# Patient Record
Sex: Male | Born: 1970 | Race: White | Hispanic: No | Marital: Single | Smoking: Never smoker
Health system: Southern US, Community
[De-identification: ages and names within clinical notes are randomized; demographics above are authoritative.]

---
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
## 2004-02-19 ENCOUNTER — Ambulatory Visit: Payer: Self-pay | Admitting: Family Medicine

## 2004-02-25 ENCOUNTER — Ambulatory Visit: Payer: Self-pay | Admitting: Family Medicine

## 2004-03-28 ENCOUNTER — Ambulatory Visit: Payer: Self-pay | Admitting: Family Medicine

## 2006-02-03 ENCOUNTER — Emergency Department (HOSPITAL_COMMUNITY): Admission: EM | Admit: 2006-02-03 | Discharge: 2006-02-03 | Payer: Self-pay | Admitting: Family Medicine

## 2006-06-12 ENCOUNTER — Ambulatory Visit: Payer: Self-pay | Admitting: Family Medicine

## 2007-01-14 ENCOUNTER — Ambulatory Visit: Payer: Self-pay | Admitting: Family Medicine

## 2007-01-14 DIAGNOSIS — J018 Other acute sinusitis: Secondary | ICD-10-CM
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
## 2010-08-26 ENCOUNTER — Other Ambulatory Visit (INDEPENDENT_AMBULATORY_CARE_PROVIDER_SITE_OTHER): Payer: BC Managed Care – PPO

## 2010-08-26 DIAGNOSIS — Z0000 Encounter for general adult medical examination without abnormal findings: Secondary | ICD-10-CM

## 2010-08-26 LAB — BASIC METABOLIC PANEL
CO2: 29 mEq/L (ref 19–32)
Calcium: 9.3 mg/dL (ref 8.4–10.5)
Chloride: 109 mEq/L (ref 96–112)
Creatinine, Ser: 1 mg/dL (ref 0.4–1.5)
Glucose, Bld: 88 mg/dL (ref 70–99)
Sodium: 144 mEq/L (ref 135–145)

## 2010-08-26 LAB — LIPID PANEL
HDL: 60.2 mg/dL (ref >39.00)
LDL Cholesterol: 86 mg/dL (ref 0–99)
VLDL: 5.4 mg/dL (ref 0.0–40.0)

## 2010-08-26 LAB — CBC WITH DIFFERENTIAL/PLATELET
Hemoglobin: 14.3 g/dL (ref 13.0–17.0)
MCV: 87 fl (ref 78.0–100.0)
Neutro Abs: 3.2 10*3/uL (ref 1.4–7.7)
Neutrophils Relative %: 58.4 % (ref 43.0–77.0)
RBC: 4.92 Mil/uL (ref 4.22–5.81)
RDW: 13.8 % (ref 11.5–14.6)

## 2010-08-26 LAB — HEPATIC FUNCTION PANEL
ALT: 18 U/L (ref 0–53)
AST: 20 U/L (ref 0–37)
Bilirubin, Direct: 0.1 mg/dL (ref 0.0–0.3)
Total Bilirubin: 1.2 mg/dL (ref 0.3–1.2)
Total Protein: 7.1 g/dL (ref 6.0–8.3)

## 2010-09-06 ENCOUNTER — Ambulatory Visit (INDEPENDENT_AMBULATORY_CARE_PROVIDER_SITE_OTHER): Payer: BC Managed Care – PPO | Admitting: Family Medicine

## 2010-09-06 ENCOUNTER — Encounter: Payer: Self-pay | Admitting: Family Medicine

## 2010-09-06 DIAGNOSIS — Z0000 Encounter for general adult medical examination without abnormal findings: Secondary | ICD-10-CM

## 2010-09-06 DIAGNOSIS — Z23 Encounter for immunization: Secondary | ICD-10-CM

## 2010-09-06 DIAGNOSIS — J018 Other acute sinusitis: Secondary | ICD-10-CM

## 2010-09-06 DIAGNOSIS — H9190 Unspecified hearing loss, unspecified ear: Secondary | ICD-10-CM

## 2010-09-06 DIAGNOSIS — H9193 Unspecified hearing loss, bilateral: Secondary | ICD-10-CM | POA: Insufficient documentation

## 2010-09-06 NOTE — Patient Instructions (Signed)
Use a 50+ a sunscreen on a daily basis and a baseball cap with a big bill to protect y face.  Return in two years for general medical exam, sooner if any problems

## 2010-09-06 NOTE — Progress Notes (Signed)
  Subjective:    Patient ID: Kevin [name], male    DOB: 1970-08-01, 39 y.o.   MRN: [id number]  HPI The fat is a 40 year old, married male, nonsmoker, who comes in today for general physical examination  Is always been in excellent health.  Has had no chronic health problems.  He was only hospitalized once for right shoulder support.  Surgery.  No major illnesses or injuries known allergies.  He takes no medication on a regular basis.  Tetanus booster given today.  Review of systems negative except for hearing loss, and  a lot of sun exposure.Also on physical examination there is evidence of chronic sun damage on his face   Review of Systems  Constitutional: Negative.   HENT: Negative.   Eyes: Negative.   Respiratory: Negative.   Cardiovascular: Negative.   Gastrointestinal: Negative.   Genitourinary: Negative.   Musculoskeletal: Negative.   Skin: Negative.   Neurological: Negative.   Hematological: Negative.   Psychiatric/Behavioral: Negative.        Objective:   Physical Exam  Constitutional: He is oriented to person, place, and time. He appears well-developed and well-nourished.  HENT:  Head: Normocephalic and atraumatic.  Right Ear: External ear normal.  Left Ear: External ear normal.  Nose: Nose normal.  Mouth/Throat: Oropharynx is clear and moist.       Bilateral cerumen impactions  Eyes: Conjunctivae and EOM are normal. Pupils are equal, round, and reactive to light.  Neck: Normal range of motion. Neck supple. No JVD present. No tracheal deviation present. No thyromegaly present.  Cardiovascular: Normal rate, regular rhythm, normal heart sounds and intact distal pulses.  Exam reveals no gallop and no friction rub.   No murmur heard. Pulmonary/Chest: Effort normal and breath sounds normal. No stridor. No respiratory distress. He has no wheezes. He has no rales. He exhibits no tenderness.  Abdominal: Soft. Bowel sounds are normal. He exhibits no distension and no mass.  There is no tenderness. There is no rebound and no guarding.  Genitourinary: Rectum normal, prostate normal and penis normal. Guaiac negative stool. No penile tenderness.  Musculoskeletal: Normal range of motion. He exhibits no edema and no tenderness.  Lymphadenopathy:    He has no cervical adenopathy.  Neurological: He is alert and oriented to person, place, and time. He has normal reflexes. No cranial nerve deficit. He exhibits normal muscle tone.  Skin: Skin is warm and dry. No rash noted. No erythema. No pallor.  Psychiatric: He has a normal mood and affect. His behavior is normal. Judgment and thought content normal.          Assessment & Plan:  Healthy male.  Bilateral cerumen impactions removed by suction and irrigation.  Evidence of sun damage as specimens knows recommended a baseball cap and sunscreens SPF 50+ daily.  Return in two years for general medical exam

## 2010-09-13 ENCOUNTER — Ambulatory Visit: Payer: BC Managed Care – PPO | Admitting: Family Medicine

## 2010-09-19 ENCOUNTER — Encounter: Payer: Self-pay | Admitting: Family Medicine

## 2010-09-19 ENCOUNTER — Ambulatory Visit (INDEPENDENT_AMBULATORY_CARE_PROVIDER_SITE_OTHER): Payer: BC Managed Care – PPO | Admitting: Family Medicine

## 2010-09-19 DIAGNOSIS — H9193 Unspecified hearing loss, bilateral: Secondary | ICD-10-CM

## 2010-09-19 DIAGNOSIS — H9190 Unspecified hearing loss, unspecified ear: Secondary | ICD-10-CM

## 2010-09-19 NOTE — Progress Notes (Signed)
  Subjective:    Patient ID: Kevin [name], male    DOB: [birth year], 40 y.o.   MRN: [id number]  HPIFred is a 40 year old male, married, nonsmoker, who returns to have the rest of his ear wax removed.  We seen last week for a physical.  He did have a lot of earwax.  We were able to remove most of it, but not all.  He's been using ear wax dissolving drops.  Kevin [name] took him to the exam room, flushed his ear out and resolve earwax problem    Review of Systems    Negative Objective:   Physical Exam   None procedure see above     Assessment & Plan:  Hearing loss, secondary to ear wax, resolved

## 2011-03-28 ENCOUNTER — Ambulatory Visit (INDEPENDENT_AMBULATORY_CARE_PROVIDER_SITE_OTHER): Payer: BC Managed Care – PPO | Admitting: Family Medicine

## 2011-03-28 ENCOUNTER — Encounter: Payer: Self-pay | Admitting: Family Medicine

## 2011-03-28 DIAGNOSIS — X58XXXA Exposure to other specified factors, initial encounter: Secondary | ICD-10-CM

## 2011-03-28 DIAGNOSIS — T148XXA Other injury of unspecified body region, initial encounter: Secondary | ICD-10-CM

## 2011-03-28 DIAGNOSIS — IMO0002 Reserved for concepts with insufficient information to code with codable children: Secondary | ICD-10-CM | POA: Insufficient documentation
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
## 2011-03-28 NOTE — Patient Instructions (Signed)
Return in 5 days for suture removal.

## 2011-08-27 ENCOUNTER — Emergency Department (HOSPITAL_COMMUNITY)
Admission: EM | Admit: 2011-08-27 | Discharge: 2011-08-27 | Disposition: A | Discharge status: Home / Self Care | Payer: BC Managed Care – PPO | Attending: Emergency Medicine | Admitting: Emergency Medicine

## 2011-08-27 ENCOUNTER — Encounter (HOSPITAL_COMMUNITY): Payer: Self-pay | Admitting: *Deleted

## 2011-08-27 ENCOUNTER — Emergency Department (HOSPITAL_COMMUNITY): Payer: BC Managed Care – PPO

## 2011-08-27 DIAGNOSIS — S61209A Unspecified open wound of unspecified finger without damage to nail, initial encounter: Secondary | ICD-10-CM | POA: Insufficient documentation

## 2011-08-27 DIAGNOSIS — W268XXA Contact with other sharp object(s), not elsewhere classified, initial encounter: Secondary | ICD-10-CM | POA: Insufficient documentation

## 2011-08-27 DIAGNOSIS — IMO0002 Reserved for concepts with insufficient information to code with codable children: Secondary | ICD-10-CM

## 2011-08-27 MED ORDER — CEPHALEXIN 500 MG PO CAPS: 500.0000 mg | ORAL_CAPSULE | Freq: Four times a day (QID) | ORAL | Status: AC

## 2011-08-27 NOTE — ED Notes (Signed)
MD at bedside.Dr. Weingold 

## 2011-08-27 NOTE — ED Notes (Signed)
Has large laceration to posterior right index finger, bleeding controlled at triage, tetanus current.

## 2011-08-27 NOTE — ED Provider Notes (Signed)
I saw and evaluated the patient, reviewed the resident's note and I agree with the findings and plan.   .Face to face Exam:  General:  Awake HEENT:  Atraumatic Resp:  Normal effort Abd:  Nondistended Neuro:No focal weakness Lymph: No adenopathy   Nelia Shi, MD 08/27/11 2120

## 2011-08-27 NOTE — Consult Note (Signed)
Reason for Consult:right index injury Referring Physician: beaton  Kevin [name] is an 41 y.o. male.  HPI: as above with right index nailbed injury  History reviewed. No pertinent past medical history.  History reviewed. No pertinent past surgical history.  History reviewed. No pertinent family history.  Social History:  reports that he has never smoked. He does not have any smokeless tobacco history on file. He reports that he does not drink alcohol or use illicit drugs.  Allergies: No Known Allergies  Medications: I have reviewed the patient's current medications.  No results found for this or any previous visit (from the past 48 hour(s)).  Dg Finger Index Right  08/27/2011  *RADIOLOGY REPORT*  Clinical Data: Finger injury.  RIGHT INDEX FINGER 2+V  Comparison: No priors.  Findings: Three views of the right second finger demonstrate no acute fracture, subluxation or dislocation.  There is mild irregularity of the soft tissues overlying the distal phalanx. Incidentally noted is a small well corticated bony fragment adjacent to the lateral base of the third proximal phalanx, likely sequela of remote avulsion fracture.  IMPRESSION: 1.  No acute radiographic abnormality of the bones of the right second finger.  Original Report Authenticated By: Florencia Reasons, M.D.    Review of Systems  All other systems reviewed and are negative.   Blood pressure 112/63, pulse 81, temperature 98.2 F (36.8 C), temperature source Oral, resp. rate 20, SpO2 96.00%. Physical Exam  Constitutional: He is oriented to person, place, and time. He appears well-developed and well-nourished.  HENT:  Head: Normocephalic and atraumatic.  Cardiovascular: Normal rate.   Respiratory: Effort normal.  Musculoskeletal:       Hands: Neurological: He is alert and oriented to person, place, and time.  Skin: Skin is warm.  Psychiatric: He has a normal mood and affect. His behavior is normal. Judgment and thought  content normal.    Assessment/Plan: As above  Patient given 2% lidocaine digital block at bedside  Prepped and draped in usual fashion and nailbed repair and skin closure performed  F/u in my office thursday  [facility] 08/27/2011, 10:19 AM

## 2011-08-27 NOTE — ED Notes (Signed)
MD at bedside. 
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
## 2013-11-25 ENCOUNTER — Telehealth: Payer: Self-pay | Admitting: Family Medicine

## 2013-11-25 DIAGNOSIS — K649 Unspecified hemorrhoids: Secondary | ICD-10-CM

## 2013-11-25 NOTE — Telephone Encounter (Signed)
Pt would like a referral to see md that specialist in hemorrhoids. Pt last ov was in 2013

## 2013-11-25 NOTE — Telephone Encounter (Signed)
Okay per Dr Todd.  Referral placed. 

## 2013-11-27 ENCOUNTER — Ambulatory Visit: Payer: BC Managed Care – PPO | Admitting: Family Medicine

## 2018-09-25 ENCOUNTER — Ambulatory Visit (INDEPENDENT_AMBULATORY_CARE_PROVIDER_SITE_OTHER): Payer: BC Managed Care – PPO | Admitting: Family Medicine

## 2018-09-25 ENCOUNTER — Other Ambulatory Visit: Payer: Self-pay

## 2018-09-25 ENCOUNTER — Encounter: Payer: Self-pay | Admitting: Family Medicine

## 2018-09-25 VITALS — BP 102/65 | HR 58 | Temp 98.7°F | Resp 14 | Wt 179.4 lb

## 2018-09-25 DIAGNOSIS — Z0000 Encounter for general adult medical examination without abnormal findings: Secondary | ICD-10-CM

## 2018-09-25 DIAGNOSIS — Z13220 Encounter for screening for lipoid disorders: Secondary | ICD-10-CM

## 2018-09-25 DIAGNOSIS — Z130 Encounter for screening for diseases of the blood and blood-forming organs and certain disorders involving the immune mechanism: Secondary | ICD-10-CM

## 2018-09-25 DIAGNOSIS — Z1329 Encounter for screening for other suspected endocrine disorder: Secondary | ICD-10-CM

## 2018-09-25 DIAGNOSIS — Z131 Encounter for screening for diabetes mellitus: Secondary | ICD-10-CM | POA: Diagnosis not present

## 2018-09-25 NOTE — Patient Instructions (Addendum)
No concerns on exam today. I will check some labs today, and let you know results in next 2 weeks. I do recommend flu vaccine - let me know if there are questions. Info on vaccines: [email or web address]   Preventive Care 32-48 Years Old, Male Preventive care refers to lifestyle choices and visits with your health care provider that can promote health and wellness. This includes:  A yearly physical exam. This is also called an annual well check.  Regular dental and eye exams.  Immunizations.  Screening for certain conditions.  Healthy lifestyle choices, such as eating a healthy diet, getting regular exercise, not using drugs or products that contain nicotine and tobacco, and limiting alcohol use. What can I expect for my preventive care visit? Physical exam Your health care provider will check:  Height and weight. These may be used to calculate body mass index (BMI), which is a measurement that tells if you are at a healthy weight.  Heart rate and blood pressure.  Your skin for abnormal spots. Counseling Your health care provider may ask you questions about:  Alcohol, tobacco, and drug use.  Emotional well-being.  Home and relationship well-being.  Sexual activity.  Eating habits.  Work and work Statistician. What immunizations do I need?  Influenza (flu) vaccine  This is recommended every year. Tetanus, diphtheria, and pertussis (Tdap) vaccine  You may need a Td booster every 10 years. Varicella (chickenpox) vaccine  You may need this vaccine if you have not already been vaccinated. Zoster (shingles) vaccine  You may need this after age 61. Measles, mumps, and rubella (MMR) vaccine  You may need at least one dose of MMR if you were born in 1957 or later. You may also need a second dose. Pneumococcal conjugate (PCV13) vaccine  You may need this if you have certain conditions and were not previously vaccinated. Pneumococcal  polysaccharide (PPSV23) vaccine  You may need one or two doses if you smoke cigarettes or if you have certain conditions. Meningococcal conjugate (MenACWY) vaccine  You may need this if you have certain conditions. Hepatitis A vaccine  You may need this if you have certain conditions or if you travel or work in places where you may be exposed to hepatitis A. Hepatitis B vaccine  You may need this if you have certain conditions or if you travel or work in places where you may be exposed to hepatitis B. Haemophilus influenzae type b (Hib) vaccine  You may need this if you have certain risk factors. Human papillomavirus (HPV) vaccine  If recommended by your health care provider, you may need three doses over 6 months. You may receive vaccines as individual doses or as more than one vaccine together in one shot (combination vaccines). Talk with your health care provider about the risks and benefits of combination vaccines. What tests do I need? Blood tests  Lipid and cholesterol levels. These may be checked every 5 years, or more frequently if you are over 18 years old.  Hepatitis C test.  Hepatitis B test. Screening  Lung cancer screening. You may have this screening every year starting at age 24 if you have a 30-pack-year history of smoking and currently smoke or have quit within the past 15 years.  Prostate cancer screening. Recommendations will vary depending on your family history and other risks.  Colorectal cancer screening. All adults should have this screening starting at age 75 and continuing until age 58. Your health care provider may recommend screening at age 41 if  you are at increased risk. You will have tests every 1-10 years, depending on your results and the type of screening test.  Diabetes screening. This is done by checking your blood sugar (glucose) after you have not eaten for a while (fasting). You may have this done every 1-3 years.  Sexually transmitted  disease (STD) testing. Follow these instructions at home: Eating and drinking  Eat a diet that includes fresh fruits and vegetables, whole grains, lean protein, and low-fat dairy products.  Take vitamin and mineral supplements as recommended by your health care provider.  Do not drink alcohol if your health care provider tells you not to drink.  If you drink alcohol: ? Limit how much you have to 0-2 drinks a day. ? Be aware of how much alcohol is in your drink. In the [place] one drink equals one 12 oz bottle of beer (355 mL), one 5 oz glass of wine (148 mL), or one 1 oz glass of hard liquor (44 mL). Lifestyle  Take daily care of your teeth and gums.  Stay active. Exercise for at least 30 minutes on 5 or more days each week.  Do not use any products that contain nicotine or tobacco, such as cigarettes, e-cigarettes, and chewing tobacco. If you need help quitting, ask your health care provider.  If you are sexually active, practice safe sex. Use a condom or other form of protection to prevent STIs (sexually transmitted infections).  Talk with your health care provider about taking a low-dose aspirin every day starting at age 21. What's next?  Go to your health care provider once a year for a well check visit.  Ask your health care provider how often you should have your eyes and teeth checked.  Stay up to date on all vaccines. This information is not intended to replace advice given to you by your health care provider. Make sure you discuss any questions you have with your health care provider. Document Released: 02/05/2015 Document Revised: 01/03/2018 Document Reviewed: 01/03/2018 Elsevier Patient Education  [employer].    If you have lab work done today you will be contacted with your lab results within the next 2 weeks.  If you have not heard from [place] then please contact us. The fastest way to get your results is to register for My Chart.   IF you received an x-ray  today, you will receive an invoice from [facility]. Please contact [facility] at [phone] with questions or concerns regarding your invoice.   IF you received labwork today, you will receive an invoice from [facility]. Please contact [facility] at [phone] with questions or concerns regarding your invoice.   Our billing staff will not be able to assist you with questions regarding bills from these companies.  You will be contacted with the lab results as soon as they are available. The fastest way to get your results is to activate your My Chart account. Instructions are located on the last page of this paperwork. If you have not heard from [place] regarding the results in 2 weeks, please contact this office.

## 2018-09-25 NOTE — Progress Notes (Signed)
Subjective:    Patient ID: Kevin [name], male    DOB: 12/12/1970, 48 y.o.   MRN: [id number]  HPI Kevin [name] is a 48 y.o. male Presents today for: Chief Complaint  Patient presents with  . Establish Care    Here for a general visit to establish care   Here to establish care, new PCP.  Prior pt of Dr. Sherren Mocha. Last visit in 2013.  No recent bloodwork.  No acute concerns today.  No hearing aids. Hearing ok now. History of cerumen impaction. Has tried some home relief in past.   R shoulder surgery, [place] procedure. 1990. Functioning ok.  Rides road and mountain bikes.  Farmer - flowers.  Has degree in microbiology. Reviews weekly MMR.  Alcohol: 1 every 2 months.  Tobacco - none.  62 yo son - Eduard Clos.  Declines STI testing.  Not fasting - ate 3 hrs ago.   FH:  Dad with bladder CA - asbestos related, diabetes.  Mom - parkinson's  Cancer screening:  No colon CA in family.  Declines prostate cancer screening.   Immunization History  Administered Date(s) Administered  . Tdap 09/06/2010  flu vaccine declines.  Depression screen [facility] 2/9 09/25/2018  Decreased Interest 0  Down, Depressed, Hopeless 0  PHQ - 2 Score 0   No corrective lenses.   Dental: infrequent visits.  Exercise:  3 days per week - 1.5 hr.  Body mass index is 24 kg/m.    Patient Active Problem List   Diagnosis Date Noted  . Laceration 03/28/2011  . Hearing loss of both ears 09/06/2010  . OTHER ACUTE SINUSITIS 01/14/2007   History reviewed. No pertinent past medical history. Past Surgical History:  Procedure Laterality Date  . BRISTOW PROCEDURE Right 1990   No Known Allergies Prior to Admission medications   Not on File   Social History   Socioeconomic History  . Marital status: Single    Spouse name: Not on file  . Number of children: Not on file  . Years of education: Not on file  . Highest education level: Not on file  Occupational History  . Not on file  Social Needs  .  Financial resource strain: Not on file  . Food insecurity    Worry: Not on file    Inability: Not on file  . Transportation needs    Medical: Not on file    Non-medical: Not on file  Tobacco Use  . Smoking status: Never Smoker  . Smokeless tobacco: Never Used  Substance and Sexual Activity  . Alcohol use: No  . Drug use: No  . Sexual activity: Not on file  Lifestyle  . Physical activity    Days per week: Not on file    Minutes per session: Not on file  . Stress: Not on file  Relationships  . Social Herbalist on phone: Not on file    Gets together: Not on file    Attends religious service: Not on file    Active member of club or organization: Not on file    Attends meetings of clubs or organizations: Not on file    Relationship status: Not on file  . Intimate partner violence    Fear of current or ex partner: Not on file    Emotionally abused: Not on file    Physically abused: Not on file    Forced sexual activity: Not on file  Other Topics Concern  . Not on file  Social History Narrative  . Not on file    Review of Systems Per HPI.     Objective:   Physical Exam Vitals signs reviewed.  Constitutional:      Appearance: He is well-developed.  HENT:     Head: Normocephalic and atraumatic.     Right Ear: External ear normal.     Left Ear: External ear normal.  Eyes:     Conjunctiva/sclera: Conjunctivae normal.     Pupils: Pupils are equal, round, and reactive to light.  Neck:     Musculoskeletal: Normal range of motion and neck supple.     Thyroid: No thyromegaly.  Cardiovascular:     Rate and Rhythm: Normal rate and regular rhythm.     Heart sounds: Normal heart sounds.  Pulmonary:     Effort: Pulmonary effort is normal. No respiratory distress.     Breath sounds: Normal breath sounds. No wheezing.  Abdominal:     General: There is no distension.     Palpations: Abdomen is soft.     Tenderness: There is no abdominal tenderness.     Hernia: A  hernia is present.  Musculoskeletal: Normal range of motion.        General: No tenderness.  Lymphadenopathy:     Cervical: No cervical adenopathy.  Skin:    General: Skin is warm and dry.  Neurological:     Mental Status: He is alert and oriented to person, place, and time.     Deep Tendon Reflexes: Reflexes are normal and symmetric.  Psychiatric:        Behavior: Behavior normal.    Vitals:   09/25/18 1104  BP: 102/65  Pulse: (!) 58  Resp: 14  Temp: 98.7 F (37.1 C)  TempSrc: Oral  SpO2: 98%  Weight: 179 lb 6.4 oz (81.4 kg)        Assessment & Plan:   Kevin [name] is a 48 y.o. male Annual physical exam  - -anticipatory guidance as below in AVS, screening labs above. Health maintenance items as above in HPI discussed/recommended as applicable.   -Flu vaccine recommended, declined at present.  Screening for diabetes mellitus - Plan: Comprehensive metabolic panel  Screening, anemia, deficiency, iron - Plan: CBC  Screening for hyperlipidemia - Plan: Lipid panel  Screening for thyroid disorder - Plan: TSH   No orders of the defined types were placed in this encounter.  Patient Instructions    No concerns on exam today. I will check some labs today, and let you know results in next 2 weeks. I do recommend flu vaccine - let me know if there are questions. Info on vaccines: [email or web address]   Preventive Care 26-18 Years Old, Male Preventive care refers to lifestyle choices and visits with your health care provider that can promote health and wellness. This includes:  A yearly physical exam. This is also called an annual well check.  Regular dental and eye exams.  Immunizations.  Screening for certain conditions.  Healthy lifestyle choices, such as eating a healthy diet, getting regular exercise, not using drugs or products that contain nicotine and tobacco, and limiting alcohol use. What can I expect for my preventive care  visit? Physical exam Your health care provider will check:  Height and weight. These may be used to calculate body mass index (BMI), which is a measurement that tells if you are at a healthy weight.  Heart rate and blood pressure.  Your skin for abnormal spots. Counseling Your health care provider  may ask you questions about:  Alcohol, tobacco, and drug use.  Emotional well-being.  Home and relationship well-being.  Sexual activity.  Eating habits.  Work and work Statistician. What immunizations do I need?  Influenza (flu) vaccine  This is recommended every year. Tetanus, diphtheria, and pertussis (Tdap) vaccine  You may need a Td booster every 10 years. Varicella (chickenpox) vaccine  You may need this vaccine if you have not already been vaccinated. Zoster (shingles) vaccine  You may need this after age 61. Measles, mumps, and rubella (MMR) vaccine  You may need at least one dose of MMR if you were born in 1957 or later. You may also need a second dose. Pneumococcal conjugate (PCV13) vaccine  You may need this if you have certain conditions and were not previously vaccinated. Pneumococcal polysaccharide (PPSV23) vaccine  You may need one or two doses if you smoke cigarettes or if you have certain conditions. Meningococcal conjugate (MenACWY) vaccine  You may need this if you have certain conditions. Hepatitis A vaccine  You may need this if you have certain conditions or if you travel or work in places where you may be exposed to hepatitis A. Hepatitis B vaccine  You may need this if you have certain conditions or if you travel or work in places where you may be exposed to hepatitis B. Haemophilus influenzae type b (Hib) vaccine  You may need this if you have certain risk factors. Human papillomavirus (HPV) vaccine  If recommended by your health care provider, you may need three doses over 6 months. You may receive vaccines as individual doses or as more  than one vaccine together in one shot (combination vaccines). Talk with your health care provider about the risks and benefits of combination vaccines. What tests do I need? Blood tests  Lipid and cholesterol levels. These may be checked every 5 years, or more frequently if you are over 66 years old.  Hepatitis C test.  Hepatitis B test. Screening  Lung cancer screening. You may have this screening every year starting at age 26 if you have a 30-pack-year history of smoking and currently smoke or have quit within the past 15 years.  Prostate cancer screening. Recommendations will vary depending on your family history and other risks.  Colorectal cancer screening. All adults should have this screening starting at age 83 and continuing until age 43. Your health care provider may recommend screening at age 68 if you are at increased risk. You will have tests every 1-10 years, depending on your results and the type of screening test.  Diabetes screening. This is done by checking your blood sugar (glucose) after you have not eaten for a while (fasting). You may have this done every 1-3 years.  Sexually transmitted disease (STD) testing. Follow these instructions at home: Eating and drinking  Eat a diet that includes fresh fruits and vegetables, whole grains, lean protein, and low-fat dairy products.  Take vitamin and mineral supplements as recommended by your health care provider.  Do not drink alcohol if your health care provider tells you not to drink.  If you drink alcohol: ? Limit how much you have to 0-2 drinks a day. ? Be aware of how much alcohol is in your drink. In the [place] one drink equals one 12 oz bottle of beer (355 mL), one 5 oz glass of wine (148 mL), or one 1 oz glass of hard liquor (44 mL). Lifestyle  Take daily care of your teeth and gums.  Stay active. Exercise for at least 30 minutes on 5 or more days each week.  Do not use any products that contain nicotine or  tobacco, such as cigarettes, e-cigarettes, and chewing tobacco. If you need help quitting, ask your health care provider.  If you are sexually active, practice safe sex. Use a condom or other form of protection to prevent STIs (sexually transmitted infections).  Talk with your health care provider about taking a low-dose aspirin every day starting at age 32. What's next?  Go to your health care provider once a year for a well check visit.  Ask your health care provider how often you should have your eyes and teeth checked.  Stay up to date on all vaccines. This information is not intended to replace advice given to you by your health care provider. Make sure you discuss any questions you have with your health care provider. Document Released: 02/05/2015 Document Revised: 01/03/2018 Document Reviewed: 01/03/2018 Elsevier Patient Education  [employer].    If you have lab work done today you will be contacted with your lab results within the next 2 weeks.  If you have not heard from [place] then please contact us. The fastest way to get your results is to register for My Chart.   IF you received an x-ray today, you will receive an invoice from [facility]. Please contact [facility] at [phone] with questions or concerns regarding your invoice.   IF you received labwork today, you will receive an invoice from [facility]. Please contact [facility] at [phone] with questions or concerns regarding your invoice.   Our billing staff will not be able to assist you with questions regarding bills from these companies.  You will be contacted with the lab results as soon as they are available. The fastest way to get your results is to activate your My Chart account. Instructions are located on the last page of this paperwork. If you have not heard from [place] regarding the results in 2 weeks, please contact this office.       Signed,   Merri Ray, MD [facility]  [place].  09/25/18 8:31 PM

## 2018-09-26 LAB — LIPID PANEL
Chol/HDL Ratio: 2.8 ratio (ref 0.0–5.0)
Cholesterol, Total: 187 mg/dL (ref 100–199)
HDL: 67 mg/dL (ref >39)
LDL Chol Calc (NIH): 108 mg/dL — ABNORMAL HIGH (ref 0–99)
Triglycerides: 64 mg/dL (ref 0–149)
VLDL Cholesterol Cal: 12 mg/dL (ref 5–40)

## 2018-09-26 LAB — CBC
Hematocrit: 46.6 % (ref 37.5–51.0)
Hemoglobin: 15.8 g/dL (ref 13.0–17.7)
MCH: 29.3 pg (ref 26.6–33.0)
MCHC: 33.9 g/dL (ref 31.5–35.7)
MCV: 87 fL (ref 79–97)
Platelets: 212 10*3/uL (ref 150–450)
RBC: 5.39 x10E6/uL (ref 4.14–5.80)
RDW: 12.7 % (ref 11.6–15.4)
WBC: 5.1 10*3/uL (ref 3.4–10.8)

## 2018-09-26 LAB — COMPREHENSIVE METABOLIC PANEL
ALT: 27 IU/L (ref 0–44)
AST: 31 IU/L (ref 0–40)
Albumin/Globulin Ratio: 1.8 (ref 1.2–2.2)
Albumin: 4.7 g/dL (ref 4.0–5.0)
Alkaline Phosphatase: 66 IU/L (ref 39–117)
BUN/Creatinine Ratio: 21 — ABNORMAL HIGH (ref 9–20)
BUN: 20 mg/dL (ref 6–24)
Bilirubin Total: 0.4 mg/dL (ref 0.0–1.2)
CO2: 22 mmol/L (ref 20–29)
Calcium: 9.8 mg/dL (ref 8.7–10.2)
Chloride: 104 mmol/L (ref 96–106)
Creatinine, Ser: 0.97 mg/dL (ref 0.76–1.27)
GFR calc Af Amer: 107 mL/min/{1.73_m2} (ref >59)
GFR calc non Af Amer: 93 mL/min/{1.73_m2} (ref >59)
Globulin, Total: 2.6 g/dL (ref 1.5–4.5)
Glucose: 96 mg/dL (ref 65–99)
Potassium: 4.3 mmol/L (ref 3.5–5.2)
Sodium: 140 mmol/L (ref 134–144)
Total Protein: 7.3 g/dL (ref 6.0–8.5)

## 2018-09-26 LAB — TSH: TSH: 2.22 u[IU]/mL (ref 0.450–4.500)

## 2018-12-21 DIAGNOSIS — S61411A Laceration without foreign body of right hand, initial encounter: Secondary | ICD-10-CM | POA: Diagnosis not present

## 2019-02-05 DIAGNOSIS — Z113 Encounter for screening for infections with a predominantly sexual mode of transmission: Secondary | ICD-10-CM | POA: Diagnosis not present

## 2019-03-08 ENCOUNTER — Encounter: Payer: Self-pay | Admitting: Family Medicine

## 2019-03-14 ENCOUNTER — Telehealth: Payer: BC Managed Care – PPO | Admitting: Family Medicine

## 2019-03-17 ENCOUNTER — Other Ambulatory Visit: Payer: Self-pay

## 2019-03-17 ENCOUNTER — Encounter: Payer: Self-pay | Admitting: Family Medicine

## 2019-03-17 ENCOUNTER — Ambulatory Visit (INDEPENDENT_AMBULATORY_CARE_PROVIDER_SITE_OTHER): Payer: BC Managed Care – PPO | Admitting: Family Medicine

## 2019-03-17 VITALS — BP 104/73 | HR 58 | Temp 98.3°F | Ht 72.5 in | Wt 181.0 lb

## 2019-03-17 DIAGNOSIS — H6192 Disorder of left external ear, unspecified: Secondary | ICD-10-CM | POA: Diagnosis not present

## 2019-03-17 NOTE — Patient Instructions (Addendum)
Referrals will call you about specific dermatologist. Let me know if there are questions.   If you have lab work done today you will be contacted with your lab results within the next 2 weeks.  If you have not heard from [place] then please contact us. The fastest way to get your results is to register for My Chart.   IF you received an x-ray today, you will receive an invoice from [facility]. Please contact [facility] at [phone] with questions or concerns regarding your invoice.   IF you received labwork today, you will receive an invoice from [place] [facility]. Please contact [facility] at [phone] with questions or concerns regarding your invoice.   Our billing staff will not be able to assist you with questions regarding bills from these companies.  You will be contacted with the lab results as soon as they are available. The fastest way to get your results is to activate your My Chart account. Instructions are located on the last page of this paperwork. If you have not heard from [place] regarding the results in 2 weeks, please contact this office.

## 2019-03-17 NOTE — Progress Notes (Signed)
Subjective:  Patient ID: Kevin [name], male    DOB: 1970/10/08  Age: 49 y.o. MRN: [id number]  CC:  Chief Complaint  Patient presents with  . spot on ear    L ear noticed about 2 months ,slow healing scab    HPI Kevin [name] presents for   Spot on upper left hear. Possible pimple in November - small amt of blood? Slow to heal, scabbed up at times, then scab returns.  No itching.  No history of skin cancer, no FH of skin cancer. No dermatologist.   Outside work - Surveyor, mining. No regular use of sunblock.   History Patient Active Problem List   Diagnosis Date Noted  . Laceration 03/28/2011  . Hearing loss of both ears 09/06/2010  . OTHER ACUTE SINUSITIS 01/14/2007   No past medical history on file. Past Surgical History:  Procedure Laterality Date  . BRISTOW PROCEDURE Right 1990   No Known Allergies Prior to Admission medications   Not on File   Social History   Socioeconomic History  . Marital status: Single    Spouse name: Not on file  . Number of children: Not on file  . Years of education: Not on file  . Highest education level: Not on file  Occupational History  . Not on file  Tobacco Use  . Smoking status: Never Smoker  . Smokeless tobacco: Never Used  Substance and Sexual Activity  . Alcohol use: No  . Drug use: No  . Sexual activity: Not on file  Other Topics Concern  . Not on file  Social History Narrative  . Not on file   Social Determinants of Health   Financial Resource Strain:   . Difficulty of Paying Living Expenses: Not on file  Food Insecurity:   . Worried About Charity fundraiser in the Last Year: Not on file  . Ran Out of Food in the Last Year: Not on file  Transportation Needs:   . Lack of Transportation (Medical): Not on file  . Lack of Transportation (Non-Medical): Not on file  Physical Activity:   . Days of Exercise per Week: Not on file  . Minutes of Exercise per Session: Not on file  Stress:   . Feeling of Stress : Not  on file  Social Connections:   . Frequency of Communication with Friends and Family: Not on file  . Frequency of Social Gatherings with Friends and Family: Not on file  . Attends Religious Services: Not on file  . Active Member of Clubs or Organizations: Not on file  . Attends Archivist Meetings: Not on file  . Marital Status: Not on file  Intimate Partner Violence:   . Fear of Current or Ex-Partner: Not on file  . Emotionally Abused: Not on file  . Physically Abused: Not on file  . Sexually Abused: Not on file    Review of Systems   Objective:   Vitals:   03/17/19 1651  BP: 104/73  Pulse: (!) 58  Temp: 98.3 F (36.8 C)  TempSrc: Temporal  SpO2: 96%  Weight: 181 lb (82.1 kg)  Height: 6' 0.5" (1.842 m)     Physical Exam Constitutional:      General: [name] not in acute distress.    Appearance: [name] well-developed.  HENT:     Head: Normocephalic and atraumatic.     Ears:   Cardiovascular:     Rate and Rhythm: Normal rate.  Pulmonary:  Effort: Pulmonary effort [name] normal.  Neurological:     Mental Status: [name] alert and oriented to person, place, and time.        Assessment & Plan:  Kevin [name] [name] a 49 y.o. male . Lesion of left external ear - Plan: Ambulatory referral to Dermatology, CANCELED: Ambulatory referral to Dermatology Slow healing lesion of the left superior ear.  Differential includes squamous cell skin cancer, less likely basal cell, or scar tissue from prior wound.  Given time since initial symptoms, I do think evaluation by dermatology with likely biopsy [name].  Referral placed.  No orders of the defined types were placed in this encounter.  Patient Instructions   Referrals will call you about specific dermatologist. Let me know if there are questions.   If you have lab work done today you will be contacted with your lab results within the next 2 weeks.  If you have not heard from [place] then please contact us. The fastest  way to get your results [name] to register for My Chart.   IF you received an x-ray today, you will receive an invoice from [facility]. Please contact [facility] at [phone] with questions or concerns regarding your invoice.   IF you received labwork today, you will receive an invoice from [place]. Please contact [facility] at [phone] with questions or concerns regarding your invoice.   Our billing staff will not be able to assist you with questions regarding bills from these companies.  You will be contacted with the lab results as soon as they are available. The fastest way to get your results [name] to activate your My Chart account. Instructions are located on the last page of this paperwork. If you have not heard from [place] regarding the results in 2 weeks, please contact this office.         Signed, Meredith Staggers, MD [facility]

## 2019-03-23 ENCOUNTER — Encounter: Payer: Self-pay | Admitting: Family Medicine

## 2019-04-08 DIAGNOSIS — L570 Actinic keratosis: Secondary | ICD-10-CM | POA: Diagnosis not present

## 2019-12-05 ENCOUNTER — Other Ambulatory Visit: Payer: Self-pay

## 2019-12-05 ENCOUNTER — Ambulatory Visit
Admission: EM | Admit: 2019-12-05 | Discharge: 2019-12-05 | Disposition: A | Discharge status: Home / Self Care | Payer: BC Managed Care – PPO | Attending: Emergency Medicine | Admitting: Emergency Medicine

## 2019-12-05 DIAGNOSIS — L309 Dermatitis, unspecified: Secondary | ICD-10-CM

## 2019-12-05 DIAGNOSIS — L989 Disorder of the skin and subcutaneous tissue, unspecified: Secondary | ICD-10-CM | POA: Diagnosis not present

## 2019-12-05 MED ORDER — MICONAZOLE NITRATE 2 % EX POWD: CUTANEOUS | 0 refills | Status: AC | PRN

## 2019-12-05 MED ORDER — TRIAMCINOLONE ACETONIDE 0.1 % EX CREA: 1.0000 "application " | TOPICAL_CREAM | Freq: Two times a day (BID) | CUTANEOUS | 0 refills | Status: AC

## 2019-12-05 NOTE — ED Triage Notes (Signed)
Pt started noticing rash on nose approx one week ago and it has since moved up to eyes and L temple.  Also has puffiness around eyes. Pt also notices itchiness on penis in mornings.

## 2019-12-05 NOTE — ED Provider Notes (Signed)
[facility]    CSN: [id number] Arrival date & time: 12/05/19  1429      History   Chief Complaint Chief Complaint  Patient presents with  . Rash    HPI Kevin [name] is a 49 y.o. male  Presenting for dry rash to face for the last week.  Emergent again around his nose, then began moving up to his eyes.  Denies visual changes, orbital pain, photophobia.  Also noticing similar rash to the base of penis.  Denies penile discharge, penile or testicular pain, urinary symptoms.  Change in diet, lifestyle, medications, topical products.  History reviewed. No pertinent past medical history.  Patient Active Problem List   Diagnosis Date Noted  . Laceration 03/28/2011  . Hearing loss of both ears 09/06/2010  . OTHER ACUTE SINUSITIS 01/14/2007    Past Surgical History:  Procedure Laterality Date  . BRISTOW PROCEDURE Right 1990       Home Medications    Prior to Admission medications   Medication Sig Start Date End Date Taking? Authorizing Provider  miconazole (MICOTIN) 2 % powder Apply topically as needed for itching. 12/05/19   Hall-Potvin, [place], PA-C  triamcinolone cream (KENALOG) 0.1 % Apply 1 application topically 2 (two) times daily. 12/05/19   Hall-Potvin, [place], PA-C    Family History Family History  Problem Relation Age of Onset  . Parkinson's disease Mother   . Diabetes Father     Social History Social History   Tobacco Use  . Smoking status: Never Smoker  . Smokeless tobacco: Never Used  Substance Use Topics  . Alcohol use: No  . Drug use: No     Allergies   Patient has no known allergies.   Review of Systems Review of Systems  Constitutional: Negative for fatigue and fever.  Respiratory: Negative for cough and shortness of breath.   Cardiovascular: Negative for chest pain and palpitations.  Gastrointestinal: Negative for abdominal pain, diarrhea and vomiting.  Musculoskeletal: Negative for arthralgias and myalgias.  Skin:  Positive for rash. Negative for wound.  Neurological: Negative for speech difficulty and headaches.  All other systems reviewed and are negative.    Physical Exam Triage Vital Signs ED Triage Vitals  Enc Vitals Group     BP      Pulse      Resp      Temp      Temp src      SpO2      Weight      Height      Head Circumference      Peak Flow      Pain Score      Pain Loc      Pain Edu?      Excl. in GC?    No data found.  Updated Vital Signs BP 118/80 (BP Location: Left Arm)   Pulse 69   Temp 98.4 F (36.9 C) (Oral)   Resp 18   SpO2 99%   Visual Acuity Right Eye Distance:   Left Eye Distance:   Bilateral Distance:    Right Eye Near:   Left Eye Near:    Bilateral Near:     Physical Exam Constitutional:      General: He is not in acute distress. HENT:     Head: Normocephalic and atraumatic.  Eyes:     General: No scleral icterus.    Pupils: Pupils are equal, round, and reactive to light.  Cardiovascular:     Rate and  Rhythm: Normal rate.  Pulmonary:     Effort: Pulmonary effort is normal. No respiratory distress.     Breath sounds: No wheezing.  Skin:    Coloration: Skin is not jaundiced or pale.     Findings: Rash present.     Comments: Discrete erythematous, dry, flat rash to cheeks, nose that does not spare nasolabial fold.  No telangiectasias.  1-2 mm clear, waxy papule to right cheek as well as hyperpigmented, irregular, circumferential lesion to left temple.  Neurological:     Mental Status: He is alert and oriented to person, place, and time.      UC Treatments / Results  Labs (all labs ordered are listed, but only abnormal results are displayed) Labs Reviewed - No data to display  EKG   Radiology No results found.  Procedures Procedures (including critical care time)  Medications Ordered in UC Medications - No data to display  Initial Impression / Assessment and Plan / UC Course  I have reviewed the triage vital signs and the  nursing notes.  Pertinent labs & imaging results that were available during my care of the patient were reviewed by me and considered in my medical decision making (see chart for details).     We will treat rashes supportively as below.  Low concern for infectious process at this time.  Discussed importance of following up with dermatology to further evaluate skin lesions (one pearly, the other hyperpigmented).  Return precautions discussed, pt verbalized understanding and is agreeable to plan. Final Clinical Impressions(s) / UC Diagnoses   Final diagnoses:  Dermatitis  Skin lesion of face     Discharge Instructions     Keep skin clean and dry. May apply triamcinolone twice daily x1 week. Use benadryl before bedtime. Avoid hot water as this can further dry out and irritate skin. Important to wash all clothes, bedding, blankets in hot water. Return for worsening rash, pain, swelling, redness, fever.    ED Prescriptions    Medication Sig Dispense Auth. Provider   triamcinolone cream (KENALOG) 0.1 % Apply 1 application topically 2 (two) times daily. 30 g Hall-Potvin, [place], PA-C   miconazole (MICOTIN) 2 % powder Apply topically as needed for itching. 70 g Hall-Potvin, [place], PA-C     PDMP not reviewed this encounter.   Hall-Potvin, [place], [place] 12/05/19 [phone]

## 2019-12-05 NOTE — Discharge Instructions (Addendum)
Keep skin clean and dry. °May apply triamcinolone twice daily x1 week. °Use benadryl before bedtime. °Avoid hot water as this can further dry out and irritate skin. °Important to wash all clothes, bedding, blankets in hot water. °Return for worsening rash, pain, swelling, redness, fever. °

## 2019-12-30 DIAGNOSIS — L821 Other seborrheic keratosis: Secondary | ICD-10-CM | POA: Diagnosis not present
